# Patient Record
Sex: Male | Born: 1995 | Race: White | Hispanic: No | Marital: Single | Smoking: Former smoker
Health system: Southern US, Community
[De-identification: ages and names within clinical notes are randomized; demographics above are authoritative.]

## PROBLEM LIST (undated history)

## (undated) HISTORY — PX: PILONIDAL CYST EXCISION: SHX744

---
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
